# Patient Record
Sex: Female | Born: 1965 | Race: White | Hispanic: No | Marital: Single | Smoking: Never smoker
Health system: Southern US, Community
[De-identification: ages and names within clinical notes are randomized; demographics above are authoritative.]

## PROBLEM LIST (undated history)

## (undated) DIAGNOSIS — F419 Anxiety disorder, unspecified: Secondary | ICD-10-CM

## (undated) DIAGNOSIS — K5090 Crohn's disease, unspecified, without complications: Secondary | ICD-10-CM

## (undated) DIAGNOSIS — R519 Headache, unspecified: Secondary | ICD-10-CM

## (undated) DIAGNOSIS — R51 Headache: Secondary | ICD-10-CM

## (undated) HISTORY — PX: COLON SURGERY: SHX602

## (undated) HISTORY — PX: COLOSTOMY: SHX63

---

## 2014-07-30 ENCOUNTER — Other Ambulatory Visit (HOSPITAL_BASED_OUTPATIENT_CLINIC_OR_DEPARTMENT_OTHER): Payer: Self-pay | Admitting: Unknown Physician Specialty

## 2014-07-30 DIAGNOSIS — Z1231 Encounter for screening mammogram for malignant neoplasm of breast: Secondary | ICD-10-CM

## 2014-07-31 ENCOUNTER — Ambulatory Visit (HOSPITAL_BASED_OUTPATIENT_CLINIC_OR_DEPARTMENT_OTHER)
Admission: RE | Admit: 2014-07-31 | Discharge: 2014-07-31 | Disposition: A | Discharge status: Home / Self Care | Payer: 59 | Source: Ambulatory Visit | Attending: Unknown Physician Specialty | Admitting: Unknown Physician Specialty

## 2014-07-31 DIAGNOSIS — Z1231 Encounter for screening mammogram for malignant neoplasm of breast: Secondary | ICD-10-CM

## 2015-03-29 ENCOUNTER — Emergency Department (HOSPITAL_BASED_OUTPATIENT_CLINIC_OR_DEPARTMENT_OTHER): Payer: 59

## 2015-03-29 ENCOUNTER — Emergency Department (HOSPITAL_BASED_OUTPATIENT_CLINIC_OR_DEPARTMENT_OTHER)
Admission: EM | Admit: 2015-03-29 | Discharge: 2015-03-29 | Disposition: A | Discharge status: Home / Self Care | Payer: 59 | Attending: Emergency Medicine | Admitting: Emergency Medicine

## 2015-03-29 ENCOUNTER — Encounter (HOSPITAL_BASED_OUTPATIENT_CLINIC_OR_DEPARTMENT_OTHER): Payer: Self-pay | Admitting: Emergency Medicine

## 2015-03-29 DIAGNOSIS — E860 Dehydration: Secondary | ICD-10-CM

## 2015-03-29 DIAGNOSIS — Z8719 Personal history of other diseases of the digestive system: Secondary | ICD-10-CM | POA: Insufficient documentation

## 2015-03-29 DIAGNOSIS — K921 Melena: Secondary | ICD-10-CM | POA: Diagnosis not present

## 2015-03-29 DIAGNOSIS — F419 Anxiety disorder, unspecified: Secondary | ICD-10-CM | POA: Diagnosis not present

## 2015-03-29 DIAGNOSIS — Z933 Colostomy status: Secondary | ICD-10-CM | POA: Insufficient documentation

## 2015-03-29 DIAGNOSIS — Z7952 Long term (current) use of systemic steroids: Secondary | ICD-10-CM | POA: Insufficient documentation

## 2015-03-29 DIAGNOSIS — Z79899 Other long term (current) drug therapy: Secondary | ICD-10-CM | POA: Insufficient documentation

## 2015-03-29 DIAGNOSIS — R55 Syncope and collapse: Secondary | ICD-10-CM

## 2015-03-29 HISTORY — DX: Anxiety disorder, unspecified: F41.9

## 2015-03-29 HISTORY — DX: Headache: R51

## 2015-03-29 HISTORY — DX: Crohn's disease, unspecified, without complications: K50.90

## 2015-03-29 HISTORY — DX: Headache, unspecified: R51.9

## 2015-03-29 LAB — BASIC METABOLIC PANEL
Anion gap: 6 (ref 5–15)
BUN: 22 mg/dL — ABNORMAL HIGH (ref 6–20)
CALCIUM: 8.2 mg/dL — AB (ref 8.9–10.3)
CO2: 23 mmol/L (ref 22–32)
CREATININE: 1.16 mg/dL — AB (ref 0.44–1.00)
Chloride: 106 mmol/L (ref 101–111)
GFR calc non Af Amer: 54 mL/min — ABNORMAL LOW (ref >60)
GLUCOSE: 118 mg/dL — AB (ref 65–99)
POTASSIUM: 4.1 mmol/L (ref 3.5–5.1)
SODIUM: 135 mmol/L (ref 135–145)

## 2015-03-29 LAB — CBC
HEMATOCRIT: 35.8 % — AB (ref 36.0–46.0)
Hemoglobin: 12.2 g/dL (ref 12.0–15.0)
MCH: 31.9 pg (ref 26.0–34.0)
MCHC: 34.1 g/dL (ref 30.0–36.0)
MCV: 93.5 fL (ref 78.0–100.0)
Platelets: 275 10*3/uL (ref 150–400)
RBC: 3.83 MIL/uL — ABNORMAL LOW (ref 3.87–5.11)
RDW: 13.2 % (ref 11.5–15.5)
WBC: 11.1 10*3/uL — ABNORMAL HIGH (ref 4.0–10.5)

## 2015-03-29 MED ORDER — SODIUM CHLORIDE 0.9 % IV BOLUS (SEPSIS)
1000.0000 mL | Freq: Once | INTRAVENOUS | Status: AC
  Administered 2015-03-29: 1000 mL via INTRAVENOUS

## 2015-03-29 MED ORDER — SODIUM CHLORIDE 0.9 % IV BOLUS (SEPSIS): 1000.0000 mL | Freq: Once | INTRAVENOUS | Status: DC

## 2015-03-29 NOTE — ED Notes (Signed)
Per EMS:  Pt at work, dizziness, with near syncope.  Pt had colonoscopy yesterday and was not drinking well today.  Positive orthostatics.  No N/V.  Pt has ostomy bag due to Chrohn's disease.

## 2015-03-29 NOTE — Discharge Instructions (Signed)
Dehydration, Adult °Dehydration is when you lose more fluids from the body than you take in. Vital organs like the kidneys, brain, and heart cannot function without a proper amount of fluids and salt. Any loss of fluids from the body can cause dehydration.  °CAUSES  °· Vomiting. °· Diarrhea. °· Excessive sweating. °· Excessive urine output. °· Fever. °SYMPTOMS  °Mild dehydration °· Thirst. °· Dry lips. °· Slightly dry mouth. °Moderate dehydration °· Very dry mouth. °· Sunken eyes. °· Skin does not bounce back quickly when lightly pinched and released. °· Dark urine and decreased urine production. °· Decreased tear production. °· Headache. °Severe dehydration °· Very dry mouth. °· Extreme thirst. °· Rapid, weak pulse (more than 100 beats per minute at rest). °· Cold hands and feet. °· Not able to sweat in spite of heat and temperature. °· Rapid breathing. °· Blue lips. °· Confusion and lethargy. °· Difficulty being awakened. °· Minimal urine production. °· No tears. °DIAGNOSIS  °Your caregiver will diagnose dehydration based on your symptoms and your exam. Blood and urine tests will help confirm the diagnosis. The diagnostic evaluation should also identify the cause of dehydration. °TREATMENT  °Treatment of mild or moderate dehydration can often be done at home by increasing the amount of fluids that you drink. It is best to drink small amounts of fluid more often. Drinking too much at one time can make vomiting worse. Refer to the home care instructions below. °Severe dehydration needs to be treated at the hospital where you will probably be given intravenous (IV) fluids that contain water and electrolytes. °HOME CARE INSTRUCTIONS  °· Ask your caregiver about specific rehydration instructions. °· Drink enough fluids to keep your urine clear or pale yellow. °· Drink small amounts frequently if you have nausea and vomiting. °· Eat as you normally do. °· Avoid: °· Foods or drinks high in sugar. °· Carbonated  drinks. °· Juice. °· Extremely hot or cold fluids. °· Drinks with caffeine. °· Fatty, greasy foods. °· Alcohol. °· Tobacco. °· Overeating. °· Gelatin desserts. °· Wash your hands well to avoid spreading bacteria and viruses. °· Only take over-the-counter or prescription medicines for pain, discomfort, or fever as directed by your caregiver. °· Ask your caregiver if you should continue all prescribed and over-the-counter medicines. °· Keep all follow-up appointments with your caregiver. °SEEK MEDICAL CARE IF: °· You have abdominal pain and it increases or stays in one area (localizes). °· You have a rash, stiff neck, or severe headache. °· You are irritable, sleepy, or difficult to awaken. °· You are weak, dizzy, or extremely thirsty. °SEEK IMMEDIATE MEDICAL CARE IF:  °· You are unable to keep fluids down or you get worse despite treatment. °· You have frequent episodes of vomiting or diarrhea. °· You have blood or green matter (bile) in your vomit. °· You have blood in your stool or your stool looks black and tarry. °· You have not urinated in 6 to 8 hours, or you have only urinated a small amount of very dark urine. °· You have a fever. °· You faint. °MAKE SURE YOU:  °· Understand these instructions. °· Will watch your condition. °· Will get help right away if you are not doing well or get worse. °Document Released: 10/05/2005 Document Revised: 12/28/2011 Document Reviewed: 05/25/2011 °Exit[employer]® Patient Information ©2015 [employer]. This information is not intended to replace advice given to you by your health care provider. Make sure you discuss any questions you have with your health care   provider. ° °Syncope °Syncope is a medical term for fainting or passing out. This means you lose consciousness and drop to the ground. People are generally unconscious for less than 5 minutes. You may have some muscle twitches for up to 15 seconds before waking up and returning to normal. Syncope occurs more often in older  adults, but it can happen to anyone. While most causes of syncope are not dangerous, syncope can be a sign of a serious medical problem. It is important to seek medical care.  °CAUSES  °Syncope is caused by a sudden drop in blood flow to the brain. The specific cause is often not determined. Factors that can bring on syncope include: °· Taking medicines that lower blood pressure. °· Sudden changes in posture, such as standing up quickly. °· Taking more medicine than prescribed. °· Standing in one place for too long. °· Seizure disorders. °· Dehydration and excessive exposure to heat. °· Low blood sugar (hypoglycemia). °· Straining to have a bowel movement. °· Heart disease, irregular heartbeat, or other circulatory problems. °· Fear, emotional distress, seeing blood, or severe pain. °SYMPTOMS  °Right before fainting, you may: °· Feel dizzy or light-headed. °· Feel nauseous. °· See all white or all black in your field of vision. °· Have cold, clammy skin. °DIAGNOSIS  °Your health care provider will ask about your symptoms, perform a physical exam, and perform an electrocardiogram (ECG) to record the electrical activity of your heart. Your health care provider may also perform other heart or blood tests to determine the cause of your syncope which may include: °· Transthoracic echocardiogram (TTE). During echocardiography, sound waves are used to evaluate how blood flows through your heart. °· Transesophageal echocardiogram (TEE). °· Cardiac monitoring. This allows your health care provider to monitor your heart rate and rhythm in real time. °· Holter monitor. This is a portable device that records your heartbeat and can help diagnose heart arrhythmias. It allows your health care provider to track your heart activity for several days, if needed. °· Stress tests by exercise or by giving medicine that makes the heart beat faster. °TREATMENT  °In most cases, no treatment is needed. Depending on the cause of your syncope,  your health care provider may recommend changing or stopping some of your medicines. °HOME CARE INSTRUCTIONS °· Have someone stay with you until you feel stable. °· Do not drive, use machinery, or play sports until your health care provider says it is okay. °· Keep all follow-up appointments as directed by your health care provider. °· Lie down right away if you start feeling like you might faint. Breathe deeply and steadily. Wait until all the symptoms have passed. °· Drink enough fluids to keep your urine clear or pale yellow. °· If you are taking blood pressure or heart medicine, get up slowly and take several minutes to sit and then stand. This can reduce dizziness. °SEEK IMMEDIATE MEDICAL CARE IF:  °· You have a severe headache. °· You have unusual pain in the chest, abdomen, or back. °· You are bleeding from your mouth or rectum, or you have black or tarry stool. °· You have an irregular or very fast heartbeat. °· You have pain with breathing. °· You have repeated fainting or seizure-like jerking during an episode. °· You faint when sitting or lying down. °· You have confusion. °· You have trouble walking. °· You have severe weakness. °· You have vision problems. °If you fainted, call your local emergency services (911 in [place]   Do not drive yourself to the hospital.  °MAKE SURE YOU: °· Understand these instructions. °· Will watch your condition. °· Will get help right away if you are not doing well or get worse. °Document Released: 10/05/2005 Document Revised: 10/10/2013 Document Reviewed: 12/04/2011 °[employer]® Patient Information ©2015 [employer]. This information is not intended to replace advice given to you by your health care provider. Make sure you discuss any questions you have with your health care provider. ° °

## 2015-03-29 NOTE — ED Provider Notes (Signed)
CSN: [id number]     Arrival date & time 03/29/15  1616 History   First MD Initiated Contact with Patient 03/29/15 1711     Chief Complaint  Patient presents with  . Near Syncope     (Consider location/radiation/quality/duration/timing/severity/associated sxs/prior Treatment) HPI  49 year old female with a history of Crohn's disease presents after passing out at work. The patient had a colonoscopy yesterday and went through the typical bowel prep. This was due to her history of small bowel obstructions. Today she has noticed bright red blood in her colostomy bag. This is heavier than she expected and she thinks it is about 3 bags worth in total. Today at work she was walking back to the bathroom and felt lightheaded like she's can a pass out. She sat down but didn't feel better so coworker helped take her to a couch but she passed out before she can make it. No chest pain or shortness of breath. Right now she feels fine but if she sits up or stands up like she had to for the chest x-ray she felt lightheaded again. The patient denies any abdominal pain, nausea, or vomiting. She does not think she's been drinking well since the colonoscopy.  Past Medical History  Diagnosis Date  . Crohn's disease   . Headache   . Anxiety    Past Surgical History  Procedure Laterality Date  . Colostomy    . Colon surgery     No family history on file. History  Substance Use Topics  . Smoking status: Never Smoker   . Smokeless tobacco: Not on file  . Alcohol Use: Not on file   OB History    No data available     Review of Systems  Respiratory: Negative for shortness of breath.   Cardiovascular: Negative for chest pain.  Gastrointestinal: Positive for blood in stool. Negative for nausea, vomiting and abdominal pain.  Neurological: Positive for syncope and light-headedness.      Allergies  Flagyl and Sulfa antibiotics  Home Medications   Prior to Admission medications   Medication Sig Start  Date End Date Taking? Authorizing Provider  Adalimumab (HUMIRA PEN) 40 MG/0.8ML PNKT Inject 40 mg into the skin once a week.   Yes Historical Provider, MD  azaTHIOprine (IMURAN) 50 MG tablet Take 50 mg by mouth daily.   Yes Historical Provider, MD  Cyanocobalamin (B-12 DOTS SL) Place under the tongue.   Yes Historical Provider, MD  escitalopram (LEXAPRO) 20 MG tablet Take 20 mg by mouth daily.   Yes Historical Provider, MD  Multiple Vitamins-Minerals (MULTIVITAMIN WITH MINERALS) tablet Take 1 tablet by mouth daily.   Yes Historical Provider, MD  nebivolol (BYSTOLIC) 5 MG tablet Take 5 mg by mouth daily.   Yes Historical Provider, MD  predniSONE (DELTASONE) 20 MG tablet Take 20 mg by mouth daily with breakfast.   Yes Historical Provider, MD   BP 110/68 mmHg  Pulse 74  Temp(Src) 98.7 F (37.1 C) (Oral)  Resp 20  Ht  (1.651 m)  Wt 232 lb (105.235 kg)  BMI 38.61 kg/m2  SpO2 95% Physical Exam  Constitutional: She is oriented to person, place, and time. She appears well-developed and well-nourished.  HENT:  Head: Normocephalic and atraumatic.  Right Ear: External ear normal.  Left Ear: External ear normal.  Nose: Nose normal.  Eyes: Right eye exhibits no discharge. Left eye exhibits no discharge.  Cardiovascular: Normal rate, regular rhythm and normal heart sounds.   Pulmonary/Chest: Effort normal and  breath sounds normal.  Abdominal: Soft. She exhibits no distension. There is no tenderness.  Left lower quadrant colostomy without swelling or pain  Neurological: She is alert and oriented to person, place, and time.  Skin: Skin is warm and dry.  Nursing note and vitals reviewed.   ED Course  Procedures (including critical care time) Labs Review Labs Reviewed  CBC - Abnormal; Notable for the following:    WBC 11.1 (*)    RBC 3.83 (*)    HCT 35.8 (*)    All other components within normal limits  BASIC METABOLIC PANEL - Abnormal; Notable for the following:    Glucose, Bld 118  (*)    BUN 22 (*)    Creatinine, Ser 1.16 (*)    Calcium 8.2 (*)    GFR calc non Af Amer 54 (*)    All other components within normal limits    Imaging Review Dg Chest 2 View  03/29/2015   CLINICAL DATA:  Near syncopal episode today, colonoscopy yesterday and possible dehydration  EXAM: CHEST  2 VIEW  COMPARISON:  None.  FINDINGS: The lungs are adequately inflated and clear. The heart and pulmonary vascularity are normal. The mediastinum is normal in width. There is no pleural effusion. The bony thorax is unremarkable.  IMPRESSION: There is no active cardiopulmonary disease.   Electronically Signed   By: David  [place] M.D.   On: 03/29/2015 17:11     EKG Interpretation   Date/Time:  Friday March 29 2015 16:45:20 EDT Ventricular Rate:  72 PR Interval:  144 QRS Duration: 98 QT Interval:  368 QTC Calculation: 402 R Axis:   29 Text Interpretation:  Normal sinus rhythm no acute ischemia No old tracing  to compare Confirmed by Jeniffer Culliver  MD, Tekelia Kareem ([id number]) on 03/29/2015 5:12:12 PM      MDM   Final diagnoses:  Syncope, unspecified syncope type  Dehydration    I believe patient passed out because of dehydration from colonoscopy prep and poor PO intake today. Minimal to no blood out of colostomy since being in ED. Hemoglobin is adequate and thus I doubt this is from significant acute blood loss. Feels better after fluids, able to get up and ambulate without dizziness. Will recommend continued hydration and calling her GI if bleeding recurs.    Pricilla Loveless, MD 03/30/15 [phone]

## 2015-07-18 ENCOUNTER — Other Ambulatory Visit (HOSPITAL_BASED_OUTPATIENT_CLINIC_OR_DEPARTMENT_OTHER): Payer: Self-pay | Admitting: Unknown Physician Specialty

## 2015-07-18 DIAGNOSIS — Z1231 Encounter for screening mammogram for malignant neoplasm of breast: Secondary | ICD-10-CM

## 2015-07-23 ENCOUNTER — Ambulatory Visit (HOSPITAL_BASED_OUTPATIENT_CLINIC_OR_DEPARTMENT_OTHER)
Admission: RE | Admit: 2015-07-23 | Discharge: 2015-07-23 | Disposition: A | Discharge status: Home / Self Care | Payer: 59 | Source: Ambulatory Visit | Attending: Unknown Physician Specialty | Admitting: Unknown Physician Specialty

## 2015-07-23 DIAGNOSIS — Z1231 Encounter for screening mammogram for malignant neoplasm of breast: Secondary | ICD-10-CM | POA: Diagnosis present

## 2016-07-20 ENCOUNTER — Other Ambulatory Visit (HOSPITAL_BASED_OUTPATIENT_CLINIC_OR_DEPARTMENT_OTHER): Payer: Self-pay | Admitting: Unknown Physician Specialty

## 2016-07-20 DIAGNOSIS — Z1231 Encounter for screening mammogram for malignant neoplasm of breast: Secondary | ICD-10-CM

## 2016-07-22 ENCOUNTER — Ambulatory Visit (HOSPITAL_BASED_OUTPATIENT_CLINIC_OR_DEPARTMENT_OTHER)
Admission: RE | Admit: 2016-07-22 | Discharge: 2016-07-22 | Disposition: A | Discharge status: Home / Self Care | Payer: 59 | Source: Ambulatory Visit | Attending: Unknown Physician Specialty | Admitting: Unknown Physician Specialty

## 2016-07-22 DIAGNOSIS — Z1231 Encounter for screening mammogram for malignant neoplasm of breast: Secondary | ICD-10-CM | POA: Insufficient documentation

## 2017-07-13 ENCOUNTER — Other Ambulatory Visit (HOSPITAL_BASED_OUTPATIENT_CLINIC_OR_DEPARTMENT_OTHER): Payer: Self-pay | Admitting: Unknown Physician Specialty

## 2017-07-13 DIAGNOSIS — Z1231 Encounter for screening mammogram for malignant neoplasm of breast: Secondary | ICD-10-CM

## 2017-07-20 ENCOUNTER — Ambulatory Visit (HOSPITAL_BASED_OUTPATIENT_CLINIC_OR_DEPARTMENT_OTHER)
Admission: RE | Admit: 2017-07-20 | Discharge: 2017-07-20 | Disposition: A | Discharge status: Home / Self Care | Payer: 59 | Source: Ambulatory Visit | Attending: Unknown Physician Specialty | Admitting: Unknown Physician Specialty

## 2017-07-20 DIAGNOSIS — Z1231 Encounter for screening mammogram for malignant neoplasm of breast: Secondary | ICD-10-CM | POA: Diagnosis present

## 2018-07-26 ENCOUNTER — Other Ambulatory Visit (HOSPITAL_BASED_OUTPATIENT_CLINIC_OR_DEPARTMENT_OTHER): Payer: Self-pay | Admitting: Obstetrics and Gynecology

## 2018-07-26 ENCOUNTER — Ambulatory Visit (HOSPITAL_BASED_OUTPATIENT_CLINIC_OR_DEPARTMENT_OTHER)
Admission: RE | Admit: 2018-07-26 | Discharge: 2018-07-26 | Disposition: A | Discharge status: Home / Self Care | Payer: 59 | Source: Ambulatory Visit | Attending: Obstetrics and Gynecology | Admitting: Obstetrics and Gynecology

## 2018-07-26 DIAGNOSIS — Z1231 Encounter for screening mammogram for malignant neoplasm of breast: Secondary | ICD-10-CM | POA: Insufficient documentation

## 2020-08-22 IMAGING — MG DIGITAL SCREENING BILATERAL MAMMOGRAM WITH TOMO AND CAD
6 of 10 series · 6 of 30 positions shown · non-contrast
Comparison: Previous exam(s).

CLINICAL DATA: Screening.

EXAM:
DIGITAL SCREENING BILATERAL MAMMOGRAM WITH TOMO AND CAD

[L MLO synth-2D]
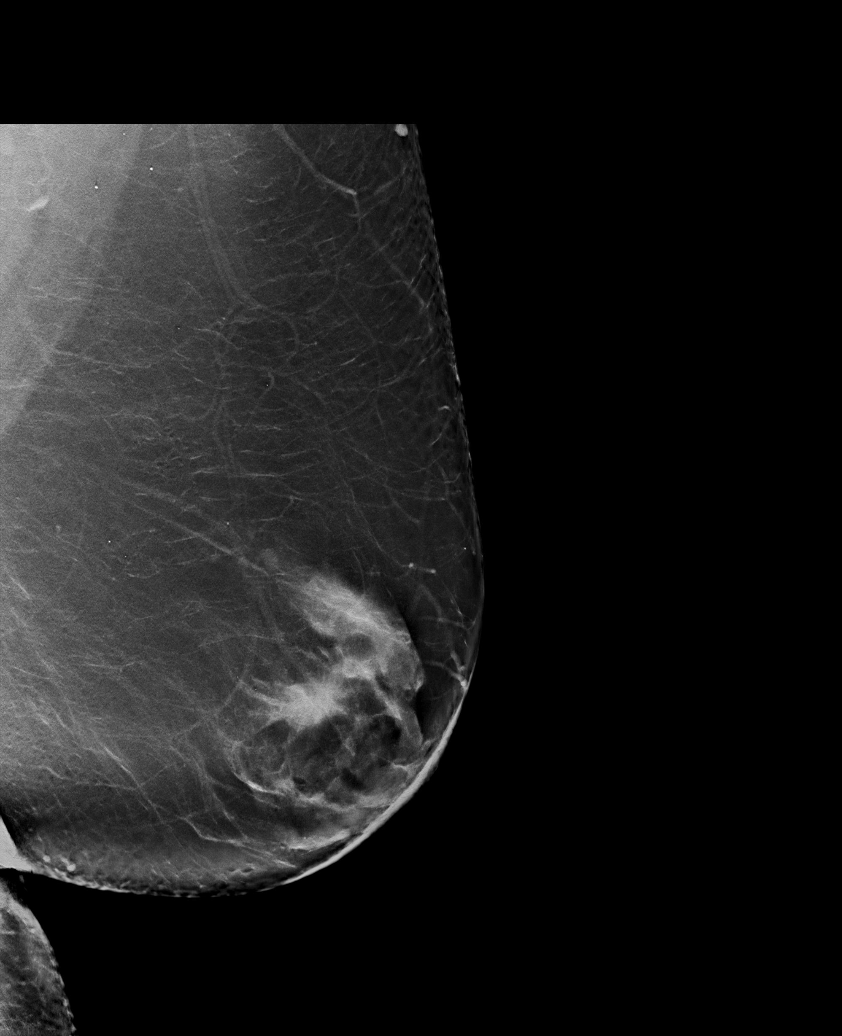

[R MLO synth-2D]
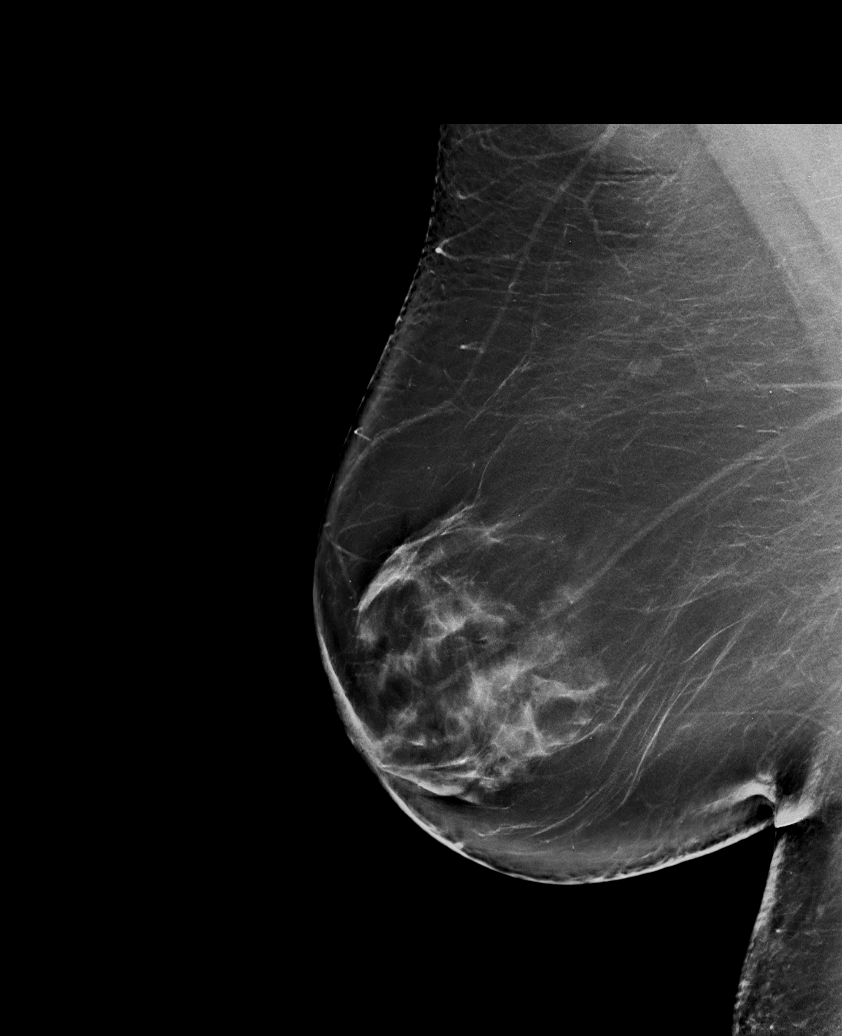

[L CC synth-2D]
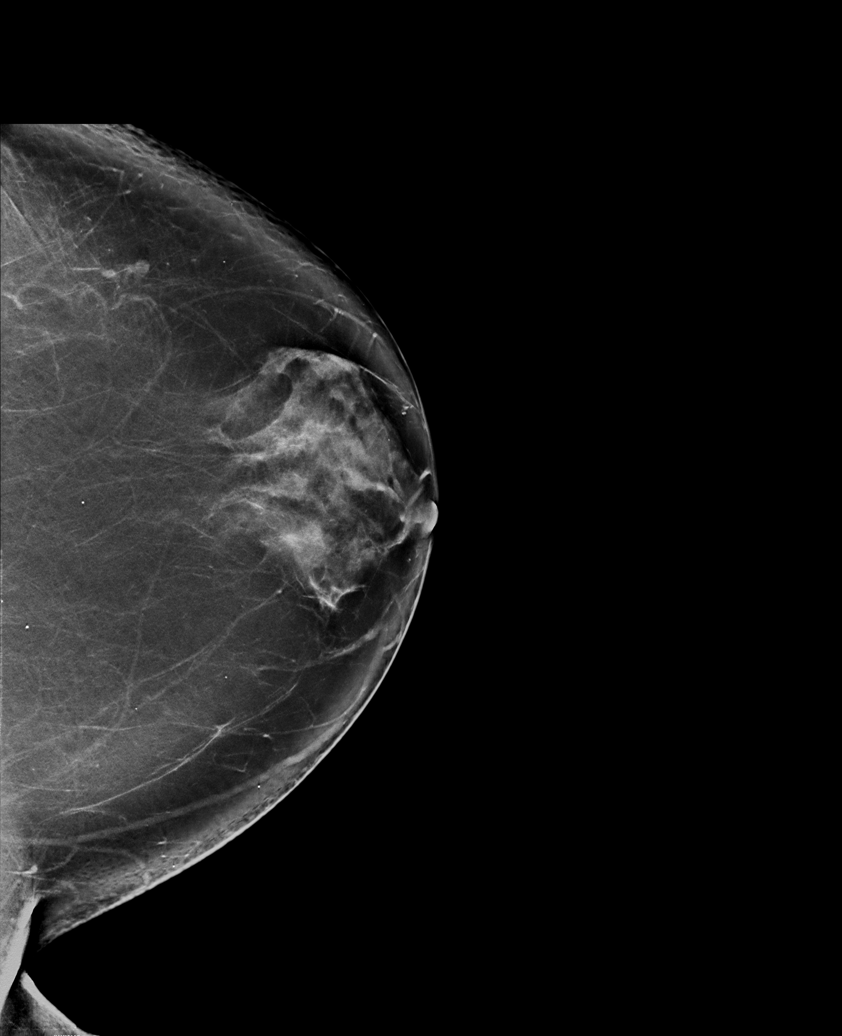

[R CC synth-2D]
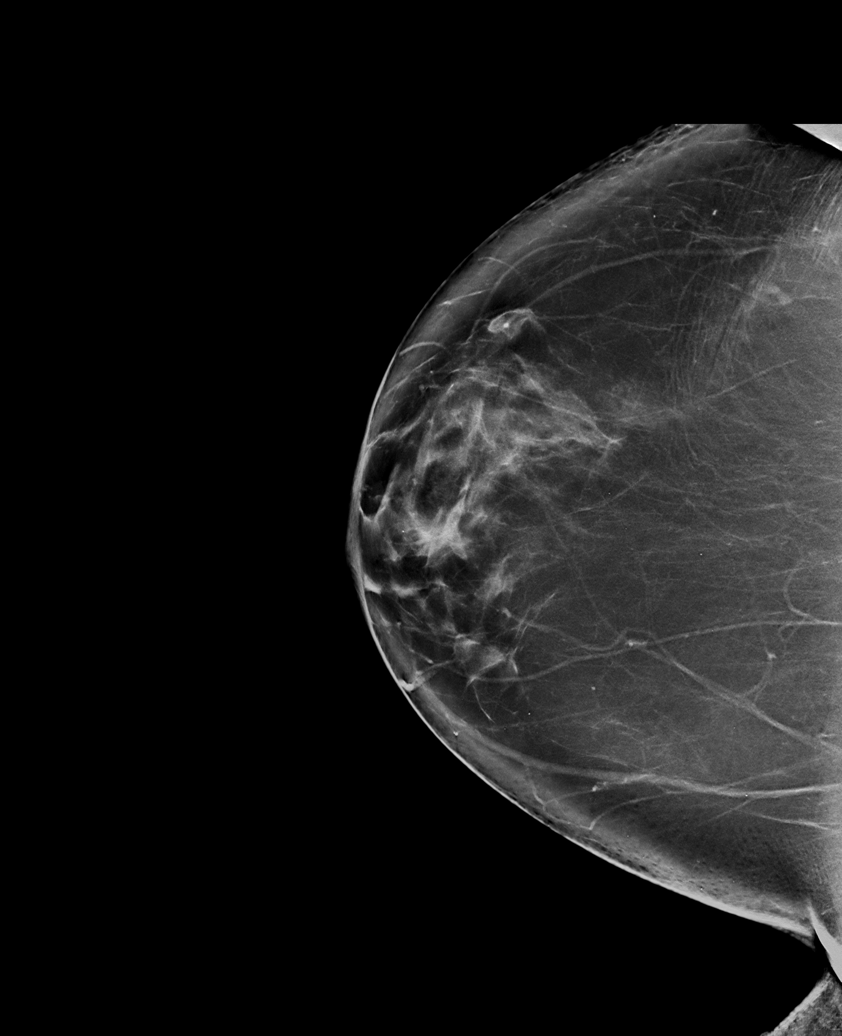

[L XCCL synth-2D]
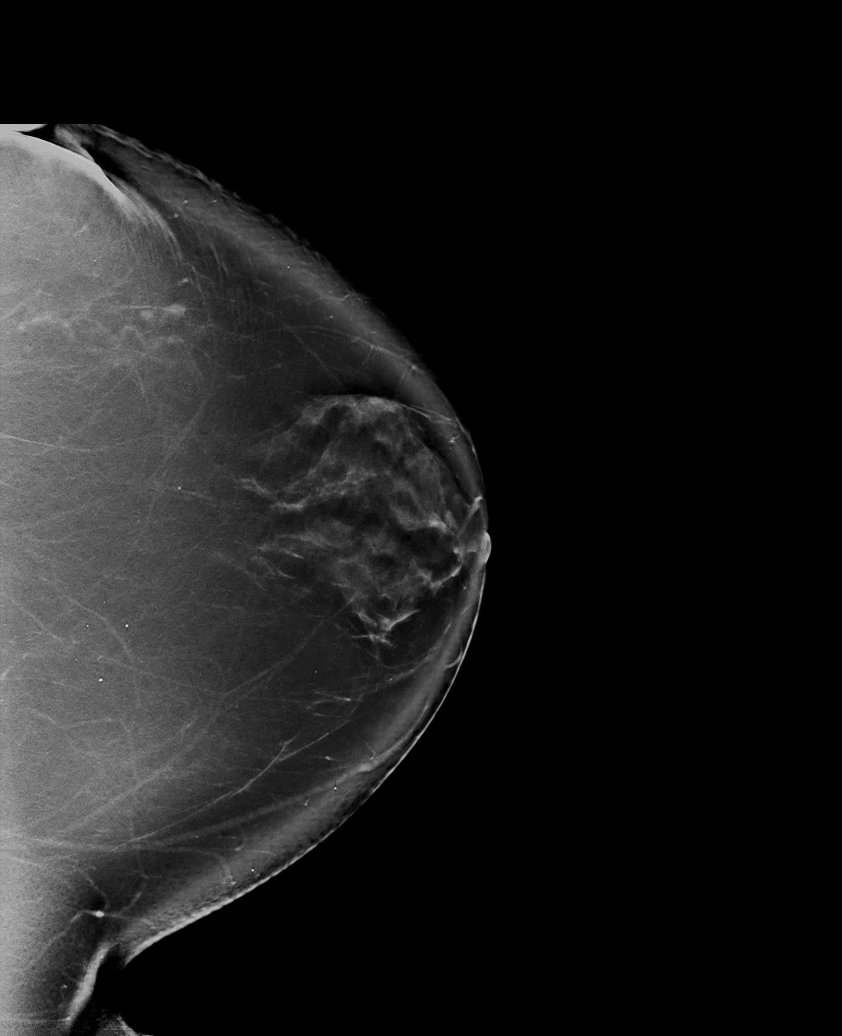

[L CC tomo · tomo slice 47/94.0]
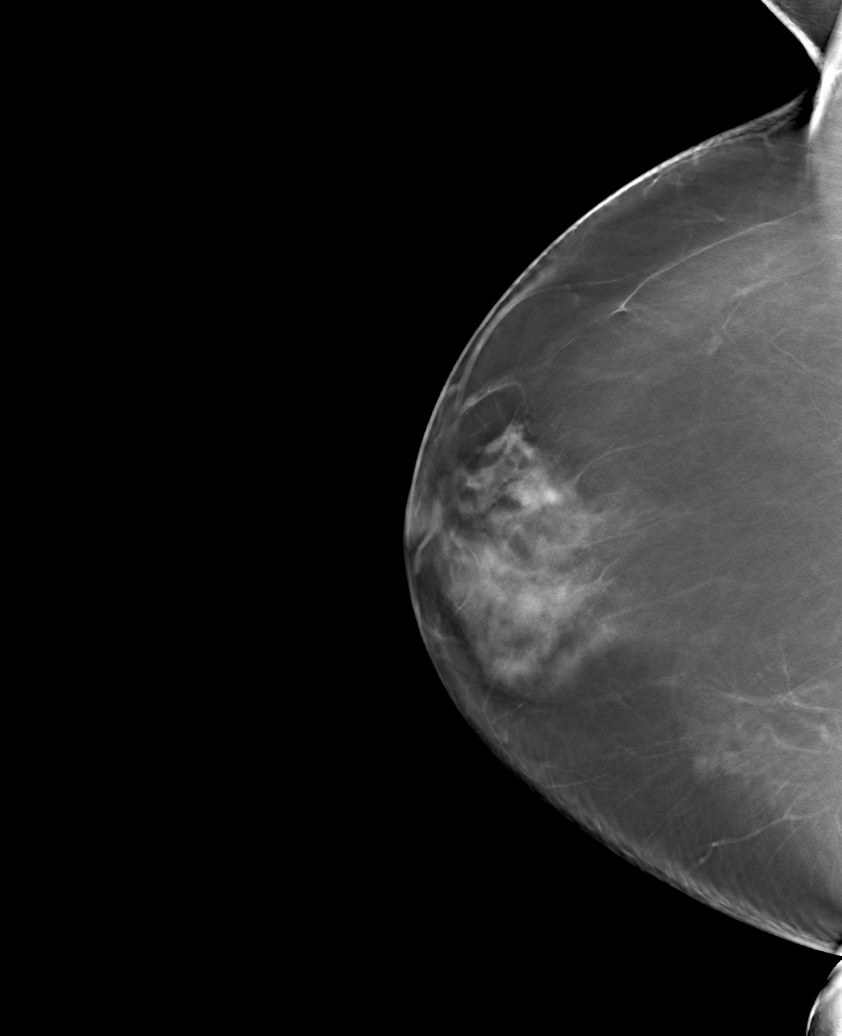

[6 of 30 positions shown; findings below may reference images not displayed]

ACR Breast Density Category c: The breast tissue is heterogeneously
dense, which may obscure small masses.
FINDINGS: There are no findings suspicious for malignancy. Images were
processed with CAD.
IMPRESSION: No mammographic evidence of malignancy. A result letter of this
screening mammogram will be mailed directly to the patient.

RECOMMENDATION:
Screening mammogram in one year. (Code:[id number])

BI-RADS CATEGORY  1: Negative.
